# Patient Record
Sex: Female | Born: 1960 | Hispanic: No | Marital: Married | Smoking: Current some day smoker
Health system: Southern US, Community
[De-identification: ages and names within clinical notes are randomized; demographics above are authoritative.]

## PROBLEM LIST (undated history)

## (undated) DIAGNOSIS — I10 Essential (primary) hypertension: Secondary | ICD-10-CM

## (undated) DIAGNOSIS — J45909 Unspecified asthma, uncomplicated: Secondary | ICD-10-CM

## (undated) HISTORY — PX: COLONOSCOPY: SHX174

---

## 2004-07-10 ENCOUNTER — Other Ambulatory Visit: Admission: RE | Admit: 2004-07-10 | Discharge: 2004-07-10 | Payer: Self-pay | Admitting: Dermatology

## 2007-12-16 ENCOUNTER — Ambulatory Visit: Payer: Self-pay | Admitting: Internal Medicine

## 2007-12-16 DIAGNOSIS — R05 Cough: Secondary | ICD-10-CM

## 2007-12-16 DIAGNOSIS — R0602 Shortness of breath: Secondary | ICD-10-CM

## 2007-12-16 DIAGNOSIS — J45909 Unspecified asthma, uncomplicated: Secondary | ICD-10-CM | POA: Insufficient documentation

## 2007-12-16 DIAGNOSIS — I10 Essential (primary) hypertension: Secondary | ICD-10-CM | POA: Insufficient documentation

## 2007-12-16 DIAGNOSIS — J309 Allergic rhinitis, unspecified: Secondary | ICD-10-CM | POA: Insufficient documentation

## 2008-01-22 ENCOUNTER — Ambulatory Visit: Payer: Self-pay | Admitting: Internal Medicine

## 2008-01-22 DIAGNOSIS — F17200 Nicotine dependence, unspecified, uncomplicated: Secondary | ICD-10-CM | POA: Insufficient documentation

## 2016-12-05 DIAGNOSIS — R52 Pain, unspecified: Secondary | ICD-10-CM | POA: Diagnosis not present

## 2016-12-10 DIAGNOSIS — Z0001 Encounter for general adult medical examination with abnormal findings: Secondary | ICD-10-CM | POA: Diagnosis not present

## 2016-12-10 DIAGNOSIS — I10 Essential (primary) hypertension: Secondary | ICD-10-CM | POA: Diagnosis not present

## 2016-12-10 DIAGNOSIS — N951 Menopausal and female climacteric states: Secondary | ICD-10-CM | POA: Diagnosis not present

## 2016-12-10 DIAGNOSIS — J301 Allergic rhinitis due to pollen: Secondary | ICD-10-CM | POA: Diagnosis not present

## 2017-08-22 DIAGNOSIS — Z720 Tobacco use: Secondary | ICD-10-CM | POA: Diagnosis not present

## 2017-08-22 DIAGNOSIS — G4700 Insomnia, unspecified: Secondary | ICD-10-CM | POA: Diagnosis not present

## 2017-11-06 DIAGNOSIS — Z6830 Body mass index (BMI) 30.0-30.9, adult: Secondary | ICD-10-CM | POA: Diagnosis not present

## 2017-11-06 DIAGNOSIS — Z0000 Encounter for general adult medical examination without abnormal findings: Secondary | ICD-10-CM | POA: Diagnosis not present

## 2017-11-06 DIAGNOSIS — Z7689 Persons encountering health services in other specified circumstances: Secondary | ICD-10-CM | POA: Diagnosis not present

## 2017-12-17 DIAGNOSIS — R609 Edema, unspecified: Secondary | ICD-10-CM | POA: Diagnosis not present

## 2017-12-17 DIAGNOSIS — I10 Essential (primary) hypertension: Secondary | ICD-10-CM | POA: Diagnosis not present

## 2017-12-17 DIAGNOSIS — J019 Acute sinusitis, unspecified: Secondary | ICD-10-CM | POA: Diagnosis not present

## 2017-12-23 DIAGNOSIS — D225 Melanocytic nevi of trunk: Secondary | ICD-10-CM | POA: Diagnosis not present

## 2017-12-23 DIAGNOSIS — Z1283 Encounter for screening for malignant neoplasm of skin: Secondary | ICD-10-CM | POA: Diagnosis not present

## 2018-04-08 DIAGNOSIS — M79671 Pain in right foot: Secondary | ICD-10-CM | POA: Diagnosis not present

## 2018-04-08 DIAGNOSIS — S99921A Unspecified injury of right foot, initial encounter: Secondary | ICD-10-CM | POA: Diagnosis not present

## 2018-04-10 DIAGNOSIS — S99921A Unspecified injury of right foot, initial encounter: Secondary | ICD-10-CM | POA: Diagnosis not present

## 2018-04-10 DIAGNOSIS — M79671 Pain in right foot: Secondary | ICD-10-CM | POA: Diagnosis not present

## 2018-04-14 DIAGNOSIS — N3001 Acute cystitis with hematuria: Secondary | ICD-10-CM | POA: Diagnosis not present

## 2018-04-14 DIAGNOSIS — R300 Dysuria: Secondary | ICD-10-CM | POA: Diagnosis not present

## 2018-09-09 DIAGNOSIS — J019 Acute sinusitis, unspecified: Secondary | ICD-10-CM | POA: Diagnosis not present

## 2018-09-09 DIAGNOSIS — Z6828 Body mass index (BMI) 28.0-28.9, adult: Secondary | ICD-10-CM | POA: Diagnosis not present

## 2018-12-14 DIAGNOSIS — R300 Dysuria: Secondary | ICD-10-CM | POA: Diagnosis not present

## 2018-12-14 DIAGNOSIS — R319 Hematuria, unspecified: Secondary | ICD-10-CM | POA: Diagnosis not present

## 2018-12-14 DIAGNOSIS — Z6829 Body mass index (BMI) 29.0-29.9, adult: Secondary | ICD-10-CM | POA: Diagnosis not present

## 2018-12-24 DIAGNOSIS — M25579 Pain in unspecified ankle and joints of unspecified foot: Secondary | ICD-10-CM | POA: Diagnosis not present

## 2018-12-24 DIAGNOSIS — M62172 Other rupture of muscle (nontraumatic), left ankle and foot: Secondary | ICD-10-CM | POA: Diagnosis not present

## 2018-12-24 DIAGNOSIS — M79671 Pain in right foot: Secondary | ICD-10-CM | POA: Diagnosis not present

## 2018-12-31 DIAGNOSIS — M79671 Pain in right foot: Secondary | ICD-10-CM | POA: Diagnosis not present

## 2018-12-31 DIAGNOSIS — S86312A Strain of muscle(s) and tendon(s) of peroneal muscle group at lower leg level, left leg, initial encounter: Secondary | ICD-10-CM | POA: Diagnosis not present

## 2018-12-31 DIAGNOSIS — M25571 Pain in right ankle and joints of right foot: Secondary | ICD-10-CM | POA: Diagnosis not present

## 2018-12-31 DIAGNOSIS — M65871 Other synovitis and tenosynovitis, right ankle and foot: Secondary | ICD-10-CM | POA: Diagnosis not present

## 2019-01-04 DIAGNOSIS — M66271 Spontaneous rupture of extensor tendons, right ankle and foot: Secondary | ICD-10-CM | POA: Diagnosis not present

## 2019-01-04 DIAGNOSIS — M79671 Pain in right foot: Secondary | ICD-10-CM | POA: Diagnosis not present

## 2019-01-04 DIAGNOSIS — M7751 Other enthesopathy of right foot: Secondary | ICD-10-CM | POA: Diagnosis not present

## 2019-01-18 DIAGNOSIS — M79671 Pain in right foot: Secondary | ICD-10-CM | POA: Diagnosis not present

## 2019-01-18 DIAGNOSIS — M66271 Spontaneous rupture of extensor tendons, right ankle and foot: Secondary | ICD-10-CM | POA: Diagnosis not present

## 2019-01-18 DIAGNOSIS — M7751 Other enthesopathy of right foot: Secondary | ICD-10-CM | POA: Diagnosis not present

## 2019-02-08 DIAGNOSIS — M79671 Pain in right foot: Secondary | ICD-10-CM | POA: Diagnosis not present

## 2019-02-08 DIAGNOSIS — M7751 Other enthesopathy of right foot: Secondary | ICD-10-CM | POA: Diagnosis not present

## 2019-02-08 DIAGNOSIS — M66271 Spontaneous rupture of extensor tendons, right ankle and foot: Secondary | ICD-10-CM | POA: Diagnosis not present

## 2021-07-28 DIAGNOSIS — I776 Arteritis, unspecified: Secondary | ICD-10-CM

## 2021-07-28 HISTORY — DX: Arteritis, unspecified: I77.6

## 2021-08-23 ENCOUNTER — Other Ambulatory Visit: Payer: Self-pay

## 2021-08-23 ENCOUNTER — Encounter (HOSPITAL_BASED_OUTPATIENT_CLINIC_OR_DEPARTMENT_OTHER): Payer: Self-pay | Admitting: Orthopedic Surgery

## 2021-08-23 NOTE — Progress Notes (Signed)
Spoke w/ via phone for pre-op interview--- [employer]----     ISTAT, EKG          Lab results------ COVID test -----patient states asymptomatic no test needed Arrive at ------- 0745 NPO after MN NO Solid Food.  Clear liquids from MN until--- 0645 Med rec completed Medications to take morning of surgery ----- Inhaler if needed. Diabetic medication ----- Patient instructed no nail polish to be worn day of surgery Patient instructed to bring photo id and insurance card day of surgery Patient aware to have Driver (ride ) / caregiver    for 24 hours after surgery  Patient Special Instructions ----- Pre-Op special Istructions ----- Patient verbalized understanding of instructions that were given at this phone interview. Patient denies shortness of breath, chest pain, fever, cough at this phone interview.

## 2021-08-23 NOTE — H&P (Signed)
Preoperative History & Physical Exam  Surgeon: Matt Holmes, MD  Diagnosis: right dequervains tenosynovitis, right radial wrist ganglion cyst  Planned Procedure: Procedure(s) (LRB): MINOR RELEASE DORSAL COMPARTMENT (DEQUERVAINS) and ganglion wrist excision (Right)  History of Present Illness:   Patient is a 60 y.o. female with symptoms consistent with  right dequervains tenosynovitis, right radial wrist ganglion cyst who presents for surgical intervention. The risks, benefits and alternatives of surgical intervention were discussed and informed consent was obtained prior to surgery.  Past Medical History: No past medical history on file.  Past Surgical History: The histories are not reviewed yet. Please review them in the "History" navigator section and refresh this [place].  Medications:  Prior to Admission medications   Not on File    Allergies:  Lisinopril  Review of Systems: Negative except per HPI.  Physical Exam: Alert and oriented, NAD Head and neck: no masses, normal alignment CV: pulse intact Pulm: no increased work of breathing, respirations even and unlabored Abdomen: non-distended Extremities: extremities warm and well perfused  LABS: No results found for this or any previous visit (from the past 2160 hour(s)).   Complete History and Physical exam available in the office notes  Alicia Mann

## 2021-08-26 NOTE — H&P (Signed)
Preoperative History & Physical Exam  Surgeon: Matt Holmes, MD  Diagnosis: right dequervains tenosynovitis, right radial wrist ganglion cyst  Planned Procedure: Procedure(s) (LRB): MINOR RELEASE DORSAL COMPARTMENT (DEQUERVAINS) and ganglion wrist excision (Right)  History of Present Illness:   Patient is a 60 y.o. female with symptoms consistent with  right dequervains tenosynovitis, right radial wrist ganglion cyst who presents for surgical intervention. The risks, benefits and alternatives of surgical intervention were discussed and informed consent was obtained prior to surgery.  Past Medical History:  Past Medical History:  Diagnosis Date   Asthma    Hypertension    Vasculitis ([place]) 07/2021    Past Surgical History:  Past Surgical History:  Procedure Laterality Date   COLONOSCOPY N/A    2012    Medications:  Prior to Admission medications   Medication Sig Start Date End Date Taking? Authorizing Provider  furosemide (LASIX) 20 MG tablet Take 20 mg by mouth.   Yes [provider]  Ipratropium-Albuterol (COMBIVENT IN) Inhale into the lungs daily as needed.   Yes [provider]  TRAZODONE HCL PO Take 50 mg by mouth.   Yes [provider]  triamterene-hydrochlorothiazide (MAXZIDE) 75-50 MG tablet Take 1 tablet by mouth daily.   Yes [provider]    Allergies:  Lisinopril  Review of Systems: Negative except per HPI.  Physical Exam: Alert and oriented, NAD Head and neck: no masses, normal alignment CV: pulse intact Pulm: no increased work of breathing, respirations even and unlabored Abdomen: non-distended Extremities: extremities warm and well perfused  LABS: No results found for this or any previous visit (from the past 2160 hour(s)).   Complete History and Physical exam available in the office notes  Orene Desanctis

## 2021-08-30 ENCOUNTER — Ambulatory Visit (HOSPITAL_BASED_OUTPATIENT_CLINIC_OR_DEPARTMENT_OTHER): Payer: 59 | Admitting: Anesthesiology

## 2021-08-30 ENCOUNTER — Encounter (HOSPITAL_BASED_OUTPATIENT_CLINIC_OR_DEPARTMENT_OTHER)
Admission: RE | Disposition: A | Discharge status: Home / Self Care | Payer: Self-pay | Source: Home / Self Care | Attending: Orthopedic Surgery

## 2021-08-30 ENCOUNTER — Encounter (HOSPITAL_BASED_OUTPATIENT_CLINIC_OR_DEPARTMENT_OTHER): Payer: Self-pay | Admitting: Orthopedic Surgery

## 2021-08-30 ENCOUNTER — Ambulatory Visit (HOSPITAL_BASED_OUTPATIENT_CLINIC_OR_DEPARTMENT_OTHER)
Admission: RE | Admit: 2021-08-30 | Discharge: 2021-08-30 | Disposition: A | Discharge status: Home / Self Care | Payer: 59 | Attending: Orthopedic Surgery | Admitting: Orthopedic Surgery

## 2021-08-30 DIAGNOSIS — M67431 Ganglion, right wrist: Secondary | ICD-10-CM | POA: Diagnosis present

## 2021-08-30 DIAGNOSIS — I10 Essential (primary) hypertension: Secondary | ICD-10-CM | POA: Insufficient documentation

## 2021-08-30 DIAGNOSIS — Z79899 Other long term (current) drug therapy: Secondary | ICD-10-CM | POA: Diagnosis not present

## 2021-08-30 DIAGNOSIS — Z888 Allergy status to other drugs, medicaments and biological substances status: Secondary | ICD-10-CM | POA: Diagnosis not present

## 2021-08-30 DIAGNOSIS — M654 Radial styloid tenosynovitis [de Quervain]: Secondary | ICD-10-CM | POA: Diagnosis present

## 2021-08-30 DIAGNOSIS — F17200 Nicotine dependence, unspecified, uncomplicated: Secondary | ICD-10-CM | POA: Diagnosis not present

## 2021-08-30 HISTORY — PX: MINOR RELEASE DORSAL COMPARTMENT (DEQUERVAINS): SHX5980

## 2021-08-30 HISTORY — DX: Unspecified asthma, uncomplicated: J45.909

## 2021-08-30 HISTORY — DX: Essential (primary) hypertension: I10

## 2021-08-30 LAB — POCT I-STAT, CHEM 8
BUN: 25 mg/dL — ABNORMAL HIGH (ref 6–20)
Calcium, Ion: 1.26 mmol/L (ref 1.15–1.40)
Chloride: 95 mmol/L — ABNORMAL LOW (ref 98–111)
Creatinine, Ser: 1.2 mg/dL — ABNORMAL HIGH (ref 0.44–1.00)
Glucose, Bld: 97 mg/dL (ref 70–99)
HCT: 53 % — ABNORMAL HIGH (ref 36.0–46.0)
Hemoglobin: 18 g/dL — ABNORMAL HIGH (ref 12.0–15.0)
Potassium: 3.8 mmol/L (ref 3.5–5.1)
Sodium: 139 mmol/L (ref 135–145)
TCO2: 32 mmol/L (ref 22–32)

## 2021-08-30 SURGERY — MINOR RELEASE DORSAL COMPARTMENT (DEQUERVAINS)
Anesthesia: Monitor Anesthesia Care | Site: Hand | Laterality: Right

## 2021-08-30 MED ORDER — ACETAMINOPHEN 500 MG PO TABS
ORAL_TABLET | ORAL | Status: AC
  Filled 2021-08-30: qty 2

## 2021-08-30 MED ORDER — BUPIVACAINE HCL (PF) 0.5 % IJ SOLN
INTRAMUSCULAR | Status: DC | PRN
  Administered 2021-08-30: 6 mL

## 2021-08-30 MED ORDER — CEFAZOLIN SODIUM-DEXTROSE 2-4 GM/100ML-% IV SOLN
INTRAVENOUS | Status: AC
  Filled 2021-08-30: qty 100

## 2021-08-30 MED ORDER — AMISULPRIDE (ANTIEMETIC) 5 MG/2ML IV SOLN: 10.0000 mg | Freq: Once | INTRAVENOUS | Status: DC | PRN

## 2021-08-30 MED ORDER — LACTATED RINGERS IV SOLN: INTRAVENOUS | Status: DC

## 2021-08-30 MED ORDER — HYDROCODONE-ACETAMINOPHEN 7.5-325 MG PO TABS: 1.0000 | ORAL_TABLET | Freq: Once | ORAL | Status: DC | PRN

## 2021-08-30 MED ORDER — 0.9 % SODIUM CHLORIDE (POUR BTL) OPTIME
TOPICAL | Status: DC | PRN
  Administered 2021-08-30: 500 mL

## 2021-08-30 MED ORDER — PROPOFOL 10 MG/ML IV BOLUS
INTRAVENOUS | Status: DC | PRN
  Administered 2021-08-30: 40 mg via INTRAVENOUS
  Administered 2021-08-30: 20 mg via INTRAVENOUS

## 2021-08-30 MED ORDER — ACETAMINOPHEN 500 MG PO TABS
1000.0000 mg | ORAL_TABLET | Freq: Once | ORAL | Status: AC
  Administered 2021-08-30: 1000 mg via ORAL

## 2021-08-30 MED ORDER — FENTANYL CITRATE (PF) 100 MCG/2ML IJ SOLN: 25.0000 ug | INTRAMUSCULAR | Status: DC | PRN

## 2021-08-30 MED ORDER — PROMETHAZINE HCL 25 MG/ML IJ SOLN: 6.2500 mg | INTRAMUSCULAR | Status: DC | PRN

## 2021-08-30 MED ORDER — PROPOFOL 500 MG/50ML IV EMUL
INTRAVENOUS | Status: DC | PRN
  Administered 2021-08-30: 100 ug/kg/min via INTRAVENOUS

## 2021-08-30 MED ORDER — LIDOCAINE 2% (20 MG/ML) 5 ML SYRINGE
INTRAMUSCULAR | Status: AC
  Filled 2021-08-30: qty 5

## 2021-08-30 MED ORDER — BUPIVACAINE HCL (PF) 0.25 % IJ SOLN: INTRAMUSCULAR | Status: DC | PRN

## 2021-08-30 MED ORDER — CEFAZOLIN SODIUM-DEXTROSE 2-4 GM/100ML-% IV SOLN
2.0000 g | INTRAVENOUS | Status: AC
  Administered 2021-08-30: 2 g via INTRAVENOUS

## 2021-08-30 MED ORDER — LIDOCAINE 2% (20 MG/ML) 5 ML SYRINGE
INTRAMUSCULAR | Status: DC | PRN
  Administered 2021-08-30: 40 mg via INTRAVENOUS

## 2021-08-30 MED ORDER — LIDOCAINE HCL 1 % IJ SOLN
INTRAMUSCULAR | Status: DC | PRN
  Administered 2021-08-30: 4 mL

## 2021-08-30 MED ORDER — TRAMADOL HCL 50 MG PO TABS: 50.0000 mg | ORAL_TABLET | Freq: Three times a day (TID) | ORAL | 0 refills | Status: AC | PRN

## 2021-08-30 SURGICAL SUPPLY — 49 items
BLADE SURG 15 STRL LF DISP TIS (BLADE) ×1 IMPLANT
BLADE SURG 15 STRL SS (BLADE) ×2
BNDG CMPR 9X4 STRL LF SNTH (GAUZE/BANDAGES/DRESSINGS) ×1
BNDG COHESIVE 4X5 TAN ST LF (GAUZE/BANDAGES/DRESSINGS) ×2 IMPLANT
BNDG ELASTIC 4X5.8 VLCR STR LF (GAUZE/BANDAGES/DRESSINGS) ×2 IMPLANT
BNDG ESMARK 4X9 LF (GAUZE/BANDAGES/DRESSINGS) ×2 IMPLANT
CORD BIPOLAR FORCEPS 12FT (ELECTRODE) ×2 IMPLANT
COVER BACK TABLE 60X90IN (DRAPES) IMPLANT
CUFF TOURN SGL QUICK 18X4 (TOURNIQUET CUFF) IMPLANT
DECANTER SPIKE VIAL GLASS SM (MISCELLANEOUS) IMPLANT
DRAPE EXTREMITY T 121X128X90 (DISPOSABLE) ×2 IMPLANT
DRAPE IMP U-DRAPE 54X76 (DRAPES) ×4 IMPLANT
DRAPE SURG 17X23 STRL (DRAPES) ×2 IMPLANT
ELECT REM PT RETURN 9FT ADLT (ELECTROSURGICAL) ×2
ELECTRODE REM PT RTRN 9FT ADLT (ELECTROSURGICAL) ×1 IMPLANT
GAUZE SPONGE 4X4 12PLY STRL (GAUZE/BANDAGES/DRESSINGS) ×2 IMPLANT
GAUZE SPONGE 4X4 12PLY STRL LF (GAUZE/BANDAGES/DRESSINGS) ×1 IMPLANT
GAUZE XEROFORM 1X8 LF (GAUZE/BANDAGES/DRESSINGS) ×2 IMPLANT
GLOVE SURG ENC MOIS LTX SZ7.5 (GLOVE) ×2 IMPLANT
GLOVE SURG UNDER POLY LF SZ7.5 (GLOVE) ×2 IMPLANT
GOWN STRL REUS W/ TWL LRG LVL3 (GOWN DISPOSABLE) ×1 IMPLANT
GOWN STRL REUS W/TWL LRG LVL3 (GOWN DISPOSABLE) ×2
HIBICLENS CHG 4% 4OZ (MISCELLANEOUS) ×2 IMPLANT
NS IRRIG 1000ML POUR BTL (IV SOLUTION) ×2 IMPLANT
PACK BASIN DAY SURGERY FS (CUSTOM PROCEDURE TRAY) ×2 IMPLANT
PAD CAST 4YDX4 CTTN HI CHSV (CAST SUPPLIES) ×1 IMPLANT
PADDING CAST ABS 4INX4YD NS (CAST SUPPLIES) ×1
PADDING CAST ABS COTTON 4X4 ST (CAST SUPPLIES) IMPLANT
PADDING CAST COTTON 4X4 STRL (CAST SUPPLIES) ×2
PENCIL SMOKE EVACUATOR (MISCELLANEOUS) ×2 IMPLANT
SLEEVE SCD COMPRESS KNEE MED (STOCKING) IMPLANT
SLING ARM FOAM STRAP LRG (SOFTGOODS) ×2 IMPLANT
SPLINT FAST PLASTER 5X30 (CAST SUPPLIES) ×1
SPLINT PLASTER CAST FAST 5X30 (CAST SUPPLIES) ×1 IMPLANT
SPLINT PLASTER CAST XFAST 3X15 (CAST SUPPLIES) ×10 IMPLANT
SPLINT PLASTER XTRA FASTSET 3X (CAST SUPPLIES) ×10
STOCKINETTE 4X48 STRL (DRAPES) ×2 IMPLANT
STRIP CLOSURE SKIN 1/2X4 (GAUZE/BANDAGES/DRESSINGS) IMPLANT
SUCTION FRAZIER HANDLE 10FR (MISCELLANEOUS) ×2
SUCTION TUBE FRAZIER 10FR DISP (MISCELLANEOUS) ×1 IMPLANT
SUT MNCRL AB 3-0 PS2 18 (SUTURE) IMPLANT
SUT MNCRL AB 4-0 PS2 18 (SUTURE) IMPLANT
SUT PROLENE 3 0 PS 2 (SUTURE) IMPLANT
SUT VIC AB 3-0 SH 27 (SUTURE)
SUT VIC AB 3-0 SH 27X BRD (SUTURE) IMPLANT
SYR BULB EAR ULCER 3OZ GRN STR (SYRINGE) ×2 IMPLANT
TOWEL OR 17X26 10 PK STRL BLUE (TOWEL DISPOSABLE) ×2 IMPLANT
TUBE CONNECTING 12X1/4 (SUCTIONS) ×2 IMPLANT
YANKAUER SUCT BULB TIP NO VENT (SUCTIONS) IMPLANT

## 2021-08-30 NOTE — Anesthesia Preprocedure Evaluation (Addendum)
Anesthesia Evaluation  Patient identified by MRN, date of birth, ID band Patient awake    Reviewed: Allergy & Precautions, NPO status , Patient's Chart, lab work & pertinent test results  Airway Mallampati: II  TM Distance: >3 FB Neck ROM: Full    Dental no notable dental hx.    Pulmonary shortness of breath, asthma , Current Smoker,    Pulmonary exam normal breath sounds clear to auscultation       Cardiovascular hypertension, Normal cardiovascular exam Rhythm:Regular Rate:Normal  vasculitis   Neuro/Psych negative neurological ROS  negative psych ROS   GI/Hepatic negative GI ROS, Neg liver ROS,   Endo/Other  negative endocrine ROS  Renal/GU negative Renal ROS  negative genitourinary   Musculoskeletal negative musculoskeletal ROS (+)   Abdominal   Peds negative pediatric ROS (+)  Hematology negative hematology ROS (+)   Anesthesia Other Findings   Reproductive/Obstetrics negative OB ROS                            Anesthesia Physical Anesthesia Plan  ASA: 2  Anesthesia Plan: MAC   Post-op Pain Management:    Induction: Intravenous  PONV Risk Score and Plan: 1 and Propofol infusion, TIVA, Treatment may vary due to age or medical condition and Midazolam  Airway Management Planned: Natural Airway and Simple Face Mask  Additional Equipment: None  Intra-op Plan:   Post-operative Plan:   Informed Consent: I have reviewed the patients History and Physical, chart, labs and discussed the procedure including the risks, benefits and alternatives for the proposed anesthesia with the patient or authorized representative who has indicated his/her understanding and acceptance.       Plan Discussed with: CRNA, Anesthesiologist and Surgeon  Anesthesia Plan Comments: (Local by surgeon. Propofol gtt. GA/LMA as backup. Norton Blizzard, MD  )        Anesthesia Quick Evaluation

## 2021-08-30 NOTE — Op Note (Signed)
OPERATIVE NOTE  DATE OF PROCEDURE: 08/30/2021  SURGEONS:  Primary: Orene Desanctis, MD  PREOPERATIVE DIAGNOSIS: right dequervains tenosynovitis  POSTOPERATIVE DIAGNOSIS: Same  NAME OF PROCEDURE:   Right wrist dequervains release  ANESTHESIA: Monitor Anesthesia Care + Local  SKIN PREPARATION: Hibiclens  ESTIMATED BLOOD LOSS: Minimal  IMPLANTS: none  INDICATIONS:  Alicia Mann is a 60 y.o. female who has the above preoperative diagnosis. The patient has decided to proceed with surgical intervention.  Risks, benefits and alternatives of operative management were discussed including, but not limited to, risks of anesthesia complications, infection, pain, persistent symptoms, stiffness, need for future surgery.  The patient understands, agrees and elects to proceed with surgery.    DESCRIPTION OF PROCEDURE: The patient was met in the pre-operative area and their identity was verified.  The operative location and laterality was also verified and marked.  The patient was brought to the OR and was placed supine on the table.  After repeat patient identification with the operative team anesthesia was provided and the patient was prepped and draped in the usual sterile fashion.  A final timeout was performed verifying the correction patient, procedure, location and laterality.  Preoperative antibiotics were provided and then the right upper extremity was elevated and exsanguinated with an Esmarch and tourniquet inflated to 250 mmHg.  An oblique longitudinal incision was made over the right wrist first dorsal compartment.  Skin and subcutaneous tissues were divided and the radial sensory nerve was carefully retracted dorsally.  The first dorsal compartment was identified and was released completely.  There was a separate extensor pollicis brevis compartment which was released.  The APL and EPB tendons were stable within the sheath at the end of the release with flexion extension of the wrist.  There was no  notable ganglion cyst coming from the first dorsal compartment.  The wound was then thoroughly irrigated and the incision was closed with 4-0 horizontal mattress and simple interrupted nylon sutures.  A sterile soft bandage was applied.  The tourniquet was deflated and the fingers were pink and warm and well-perfused at the end of the case.  Patient tolerated the procedure well was brought to PACU for recovery in stable condition.   Matt Holmes, MD

## 2021-08-30 NOTE — Interval H&P Note (Signed)
No change since H&P. Plan for right wrist dequervains release and right wrist ganglion excision.  Izell [place], MD Orthopaedic Hand Surgeon [facility] Office number: [phone] [address], [place], [place] [address]

## 2021-08-30 NOTE — Discharge Instructions (Addendum)
Orthopaedic Hand Surgery Discharge Instructions  WEIGHT BEARING STATUS: Non weight bearing on operative extremity  INCISION CARE: Keep dressing over your incision clean and dry until 5 days after surgery. You may shower by placing a waterproof covering over your dressing. Once dressing is removed, you may allow water to run over the incision and then place Band-Aids over incision. Do not scrub your incision or apply creams/lotions. Do not submerge your incision or swim for 3 weeks after surgery. Contact your surgeon or primary care doctor if you develop redness or drainage from your incision.   PAIN CONTROL: First line medications for post operative pain control are Tylenol (acetaminophen) and Motrin (ibuprofen) if you are able to take these medications. If you have been prescribed a medication these can be taken as breakthrough pain medications. Please note that some narcotic pain medication have acetaminophen added and you should never consume more than 4,000mg  of acetaminophen in 24 hour period. Also please note that if you are given Toradol (ketoralac) you should not take similar medications simultaneously such as ibuprofen.   ICE/ELEVATION: Ice and elevate your injured extremity as needed. Avoid direct contact of ice with skin.  HOME MEDICATIONS: No changes have been made to your home medications.  FOLLOW UP: You will be called after surgery with an appointment date and time, however if you have not received a phone call within 3 days please call during regular office hours at [phone] to schedule a post operative appointment.  Please Seek Medical Attention if: Call MD for: pain or pressure in chest, jaw, arm, back, neck  Call MD for: temperature greater than 101 F for more than 24 hours  Call MD for: difficulty breathing Call MD for: Incision redness, bleeding, drainage  Call MD for: palpitations or feeling that the heart is racing  Call MD for: increased swelling in arm, leg, ankle,  or abdomen  Call MD for: lightheadedness, dizziness, fainting Go to ED or call 911 if: chest pain does not go away after 3 nitroglycerin doses taken 5 min apart  Go to ED or call 911 for: any uncontrolled bleeding  Go to ED or call 911 if: unable to reach physician  Discharge Medications: Allergies as of 08/30/2021       Reactions   Codeine Nausea And Vomiting   Lisinopril         Medication List     TAKE these medications    COMBIVENT IN Inhale into the lungs daily as needed.   furosemide 20 MG tablet Commonly known as: LASIX Take 20 mg by mouth.   traMADol 50 MG tablet Commonly known as: Ultram Take 1 tablet (50 mg total) by mouth 3 (three) times daily as needed for up to 3 days.   TRAZODONE HCL PO Take 50 mg by mouth.   triamterene-hydrochlorothiazide 75-50 MG tablet Commonly known as: MAXZIDE Take 1 tablet by mouth daily.          Izell [place], MD Orthopaedic Hand Surgeon [facility] Office number: [phone] [address], [address] [place], [place] [address]    Post Anesthesia Home Care Instructions  Activity: Get plenty of rest for the remainder of the day. A responsible individual must stay with you for 24 hours following the procedure.  For the next 24 hours, DO NOT: -Drive a car -Paediatric nurse -Drink alcoholic beverages -Take any medication unless instructed by your physician -Make any legal decisions or sign important papers.  Meals: Start with liquid foods such as gelatin or soup.  Progress to regular foods as tolerated. Avoid greasy, spicy, heavy foods. If nausea and/or vomiting occur, drink only clear liquids until the nausea and/or vomiting subsides. Call your physician if vomiting continues.  Special Instructions/Symptoms: Your throat may feel dry or sore from the anesthesia or the breathing tube placed in your throat during surgery. If this causes discomfort, gargle with warm salt water. The discomfort should disappear within  24 hours.  May take Tylenol beginning at 2 PM as needed for pain.

## 2021-08-30 NOTE — Transfer of Care (Signed)
Immediate Anesthesia Transfer of Care Note  Patient: Alicia Mann  Procedure(s) Performed: MINOR RELEASE DORSAL COMPARTMENT (DEQUERVAINS) and ganglion wrist excision (Right: Hand)  Patient Location: Short Stay  Anesthesia Type:MAC  Level of Consciousness: awake, alert , oriented and patient cooperative  Airway & Oxygen Therapy: Patient Spontanous Breathing  Post-op Assessment: Report given to RN and Post -op Vital signs reviewed and stable  Post vital signs: Reviewed and stable  Last Vitals:  Vitals Value Taken Time  BP    Temp    Pulse    Resp    SpO2      Last Pain:  Vitals:   08/30/21 0754  TempSrc: Oral  PainSc: 3       Patients Stated Pain Goal: 3 (74/82/70 [id number])  Complications: No notable events documented.

## 2021-08-30 NOTE — Anesthesia Postprocedure Evaluation (Signed)
Anesthesia Post Note  Patient: Bricia N Ramo  Procedure(s) Performed: MINOR RELEASE DORSAL COMPARTMENT (DEQUERVAINS) and ganglion wrist excision (Right: Hand)     Patient location during evaluation: PACU Anesthesia Type: MAC Level of consciousness: awake and alert Pain management: pain level controlled Vital Signs Assessment: post-procedure vital signs reviewed and stable Respiratory status: spontaneous breathing and respiratory function stable Cardiovascular status: stable Postop Assessment: no apparent nausea or vomiting Anesthetic complications: no   No notable events documented.  Last Vitals:  Vitals:   08/30/21 0754 08/30/21 1004  BP: 136/87 115/65  Pulse: (!) 106 85  Resp: 17 14  Temp: 36.9 C 36.7 C  SpO2: 99% 95%    Last Pain:  Vitals:   08/30/21 1004  TempSrc:   PainSc: 0-No pain                 Merlinda Frederick

## 2021-08-31 ENCOUNTER — Encounter (HOSPITAL_BASED_OUTPATIENT_CLINIC_OR_DEPARTMENT_OTHER): Payer: Self-pay | Admitting: Orthopedic Surgery

## 2021-09-17 ENCOUNTER — Other Ambulatory Visit: Payer: Self-pay

## 2021-09-17 ENCOUNTER — Other Ambulatory Visit (HOSPITAL_COMMUNITY): Payer: Self-pay | Admitting: Dermatology

## 2021-09-17 ENCOUNTER — Ambulatory Visit (HOSPITAL_COMMUNITY)
Admission: RE | Admit: 2021-09-17 | Discharge: 2021-09-17 | Disposition: A | Discharge status: Home / Self Care | Payer: 59 | Source: Ambulatory Visit | Attending: Dermatology | Admitting: Dermatology

## 2021-09-17 DIAGNOSIS — I776 Arteritis, unspecified: Secondary | ICD-10-CM

## 2021-10-30 NOTE — Progress Notes (Signed)
Patient labs drawn in room prior to leaving Nephrology clinic

## 2021-12-03 NOTE — Progress Notes (Signed)
 RHEUMATOLOGY NEW CLINIC NOTE  Assessment/Plan:  Alicia Mann is a 61 y.o. female with COPD, HTN, tobacco use  and 1 episode of biopsy-proven LCV, latent TB. Serologic workup is negative and she had no abdominal symptoms. Slight elevation in creatinine which may be from her medication. She has had no recurrence of rash and no new symptoms. We congratulated her on stopping smoking. We explained that if the rash comes back she should see her dermatologist again for another biopsy because we need immunofluorescence staining. We will repeat her renal function panel and Dr. Woodie will make recommendations for changing antihypertensive meds. We will see her back prn.  - renal function panel today - consider being treated for latent TB. If patient needs immunosuppression in the future, a +quantiferon will complicate this. - RTC prn   History of Present Illness:  The patient was seen in consultation at the request of Newman Woodie for the evaluation of LCV  Primary Care Provider: Beverley KATHEE Hummer, MD  HPI: Alicia Mann is a 61 y.o.  female seen by Dr. Woodie in January for evaluation of rash. In October/Noc 2022 she developed a b/l LE rash and saw Dermatology. Biopsy was performed and showed LCV - see biopsy report below. She remembers no associated symptoms at the time this started. She took prednisone and by the time she got to Dr. Woodie the rash had gone away. Lab workup:  + Quantiferon gold Urine bland Calcium 10.5 CRP 4 spep normal Ferritin 41 ANCA neg CCP neg C3 and C4 high Cryos neg Hep B and C neg Chest xray (CE) neg  Today, she reports no new lesions, no fevers, chills, HA, oral ulcers, joint swelling, cough, SOB, GI complaints. She stopped smoking with her husband!  She did see the [facility] for her latent TB and would prefer to not treat at this time.  Allergies: Codeine and Lisinopril  Medications:  Outpatient Medications Prior to Visit  Medication Sig Dispense Refill  . albuterol HFA  90 mcg/actuation inhaler     . betamethasone, augmented, (DIPROLENE) 0.05 % cream betamethasone, augmented 0.05 % topical cream  APPLY CREAM TOPICALLY TO AFFECTED AREA ON LEGS TWICE DAILY AS NEEDED DO NOT APPLY TO FACE, GROIN, OR UNDERARMS    . busPIRone (BUSPAR) 7.5 MG tablet Take 7.5 mg by mouth.    . fluticasone propionate (FLOVENT DISKUS) 100 mcg/actuation DsDv diskus Flovent Diskus 100 mcg/actuation powder for inhalation  INHALE 1 PUFF BY MOUTH TWICE DAILY    . furosemide (LASIX) 20 MG tablet Take 20 mg by mouth daily.    SABRA ipratropium (ATROVENT) 42 mcg (0.06 %) nasal spray USE 2 SPRAYS IN EACH NOSTRIL FOUR TIMES DAILY FOR 7 DAYS    . ipratropium-albuteroL (COMBIVENT RESPIMAT) 20-100 mcg/actuation Mist inhaler Combivent Respimat 20 mcg-100 mcg/actuation solution for inhalation  INHALE ONE PUFF FOUR TIMES A DAY    . methocarbamoL (ROBAXIN) 750 MG tablet TAKE 1 TO 2 TABLETS BY MOUTH THREE TIMES DAILY AS NEEDED FOR 30 DAYS    . naproxen (NAPROSYN) 500 MG tablet     . potassium chloride 10 MEQ CR tablet Take 1 tablet (10 mEq total) by mouth daily. 30 tablet 0  . traZODone (DESYREL) 50 MG tablet trazodone 50 mg tablet  TAKE 2 TABLETS BY MOUTH AT BEDTIME    . triamterene-hydrochlorothiazide (MAXZIDE) 75-50 mg per tablet Take 1 tablet by mouth daily.    . varenicline (CHANTIX) 0.5 MG tablet Take 0.5 mg by mouth.     No  facility-administered medications prior to visit.   Social History: Social History   Tobacco Use  . Smoking status: Former    Types: Cigarettes  . Smokeless tobacco: Never  Substance Use Topics  . Alcohol use: Not Currently    Comment: occasional   Family History: Father and sister with thyroid disease  Review of Systems: Please see above in the HPI, the remainder of a 10-system review was unremarkable.  Review of outside records: I have reviewed available records through Epic.   Objective   Physical Exam  General:   Well appearing female in no acute distress   Eyes:   PERRL, EOMI, conjunctivae are clear.  Cardiovascular:  RRR.  S1 and S2 normal, without any murmur, rub, or gallop.  Lungs:  Clear to auscultation bilaterally, without wheeze.  Good air movement. Normal work of breathing.  Skin:    No diffuse rash. Healing ulcers on both legs  Psychiatry:   Alert and oriented to person, place, and time  Extremities:   Warm and well perfused. No edema. 2+ pulses.  Musculo Skeletal:   No joint tenderness, deformity, effusions. Full range of motion in shoulder, elbow, hip, knee, ankle, hands and feet. No evidence of active synovitis in the small joints of the hands or feet.  Neurological:  Cranial nerves III-XII intact. Gait normal. 5/5 strength

## 2021-12-11 NOTE — Progress Notes (Addendum)
 Nephrology follow-up Note  Requesting Attending Physician:  Dr. Shona Courier: 12/04/2021-   Assessment/Recommendations:  This is a 61 year old female with history of lower extremity skin rash, with biopsy suggesting of leukocytoclastic vasculitis, but without a DIF, differential is broad -- IgAV vs immune complex or infection mediated.  Most of her autoimmune serology and inflammatory markers is unrevealing( also just came off prednisone).    Alicia Mann is here for regular follow-up with her husband.  She is doing well overall.  She denies any recurrence of her lower extremity rash, redness of eyes or ears, cough, chest pain, shortness of breath, headache, change in vision, flashes of floaters, vomiting, diarrhea, blood in urine or stool, rash, migratory joint pain, tingling or numbness.  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  We reviewed her labs from last visit.  At this point Alicia Mann is doing well overall.  We reviewed her labs.  Her creatinine has been fluctuating between 1-1.14 last year.  Her urine sediment is bland.  There is no proteinuria.  We discussed about considering changing her triamterene hydrochlorothiazide to ACEi or an ARB with or without diuretic going forward.  I will pass on the recommendations to her primary care physician Dr. Beverley.  She will alert us  of any new symptoms and signs of small vessel vasculitis which we went over.  I have also discussed the case with her dermatologist, and if she has any recurrence of rash, she will undergo a skin biopsy with direct immunofluorescence for diagnosis of LCV. We encouraged her to consider treatment for latent TB, because if she has a recurrence of rash and we consider immunosuppression, she will need treatment at that point .  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  In month of October/November, she suddenly developed bilateral lower extremity rash, following which she was evaluated by dermatology underwent a skin biopsy which showed leukocytoclastic vasculitis and was started on variable dose of prednisone.  She does not recall any history of fever, cough, fatigue, joint pain, hemoptysis, chills, sick contact, travel history, recent change of medication, starting OTC supplementation, blood in urine or stool, hearing loss, redness of eyes or ears at the time of development of her lower extremity rash.  She has history of hypertension well-controlled on medication for years.  She does not have diabetes, prior history of kidney disease, cancer, or other significant past medical history.  She does endorse to smoking a pack a day for almost 15 to 20 years.  No prior history of prolonged hospitalization, infections, or recent travel history. She is off prednisone now (was stopped about 1-2 weeks ago)    Allergies: codeine, lisinopril ( cough)  Medications:  Current Outpatient Medications  Medication Sig Dispense Refill  . albuterol HFA 90 mcg/actuation inhaler     . betamethasone, augmented, (DIPROLENE) 0.05 % cream betamethasone, augmented 0.05 % topical cream  APPLY CREAM TOPICALLY TO AFFECTED AREA ON LEGS TWICE DAILY AS NEEDED DO NOT APPLY TO FACE,  GROIN, OR UNDERARMS    . busPIRone (BUSPAR) 7.5 MG tablet Take 7.5 mg by mouth.    . fluticasone propionate (FLOVENT DISKUS) 100 mcg/actuation DsDv diskus Flovent Diskus 100 mcg/actuation powder for inhalation  INHALE 1 PUFF BY MOUTH TWICE DAILY    . furosemide (LASIX)  20 MG tablet Take 20 mg by mouth daily.    SABRA ipratropium (ATROVENT) 42 mcg (0.06 %) nasal spray USE 2 SPRAYS IN EACH NOSTRIL FOUR TIMES DAILY FOR 7 DAYS    . ipratropium-albuteroL (COMBIVENT RESPIMAT) 20-100 mcg/actuation Mist inhaler Combivent Respimat 20 mcg-100 mcg/actuation solution for inhalation  INHALE ONE PUFF FOUR TIMES A DAY    . methocarbamoL (ROBAXIN) 750 MG tablet TAKE 1 TO 2 TABLETS BY MOUTH THREE TIMES DAILY AS NEEDED FOR 30 DAYS    . naproxen (NAPROSYN) 500 MG tablet     . potassium chloride 10 MEQ CR tablet Take 1 tablet (10 mEq total) by mouth daily. 30 tablet 0  . traZODone (DESYREL) 50 MG tablet trazodone 50 mg tablet  TAKE 2 TABLETS BY MOUTH AT BEDTIME    . triamterene-hydrochlorothiazide (MAXZIDE) 75-50 mg per tablet Take 1 tablet by mouth daily.    . varenicline (CHANTIX) 0.5 MG tablet Take 0.5 mg by mouth.     No current facility-administered medications for this visit.    Review of Systems: A 12 system review of systems was negative except as noted in HPI.  Objective    Physical Exam: She is awake alert and orient x3 No auricle tenderness No conjunctival injection No oral thrush or ulcers No stridor S1-S2 is regular Lungs are clear to auscultation bilaterally Abdomen: Soft and nontender Skin: No purpuric rash MSK: No evidence of active synovitis Neuro: Clear speech, normal gait LE: No significant edema   Labs: Urine sediment personally reviewed does not show any evidence of glomerular  hematuria hgb of 15, white count of 5.8, platelet 270 Potassium of 3.2, creatinine 0.9, calcium 10.5, albumin 3.9, C3 at 178 and C4 at 55 ANCA is negative Rheumatoid factor 6.7, low Cryo is pending SPEP and IFE does not show evidence of monoclonal  serum free light chain ratio of 2.10 Reactive protein and 4 Ferritin at 41.3 TB Gold positive Hepatitis B core negative

## 2022-11-17 IMAGING — DX DG CHEST 2V
2 series · 2 of 2 positions shown · non-contrast
Comparison: 12/17/2017

CLINICAL DATA: Vasculitis chest pain

EXAM:
CHEST - 2 VIEW

[chest pa]
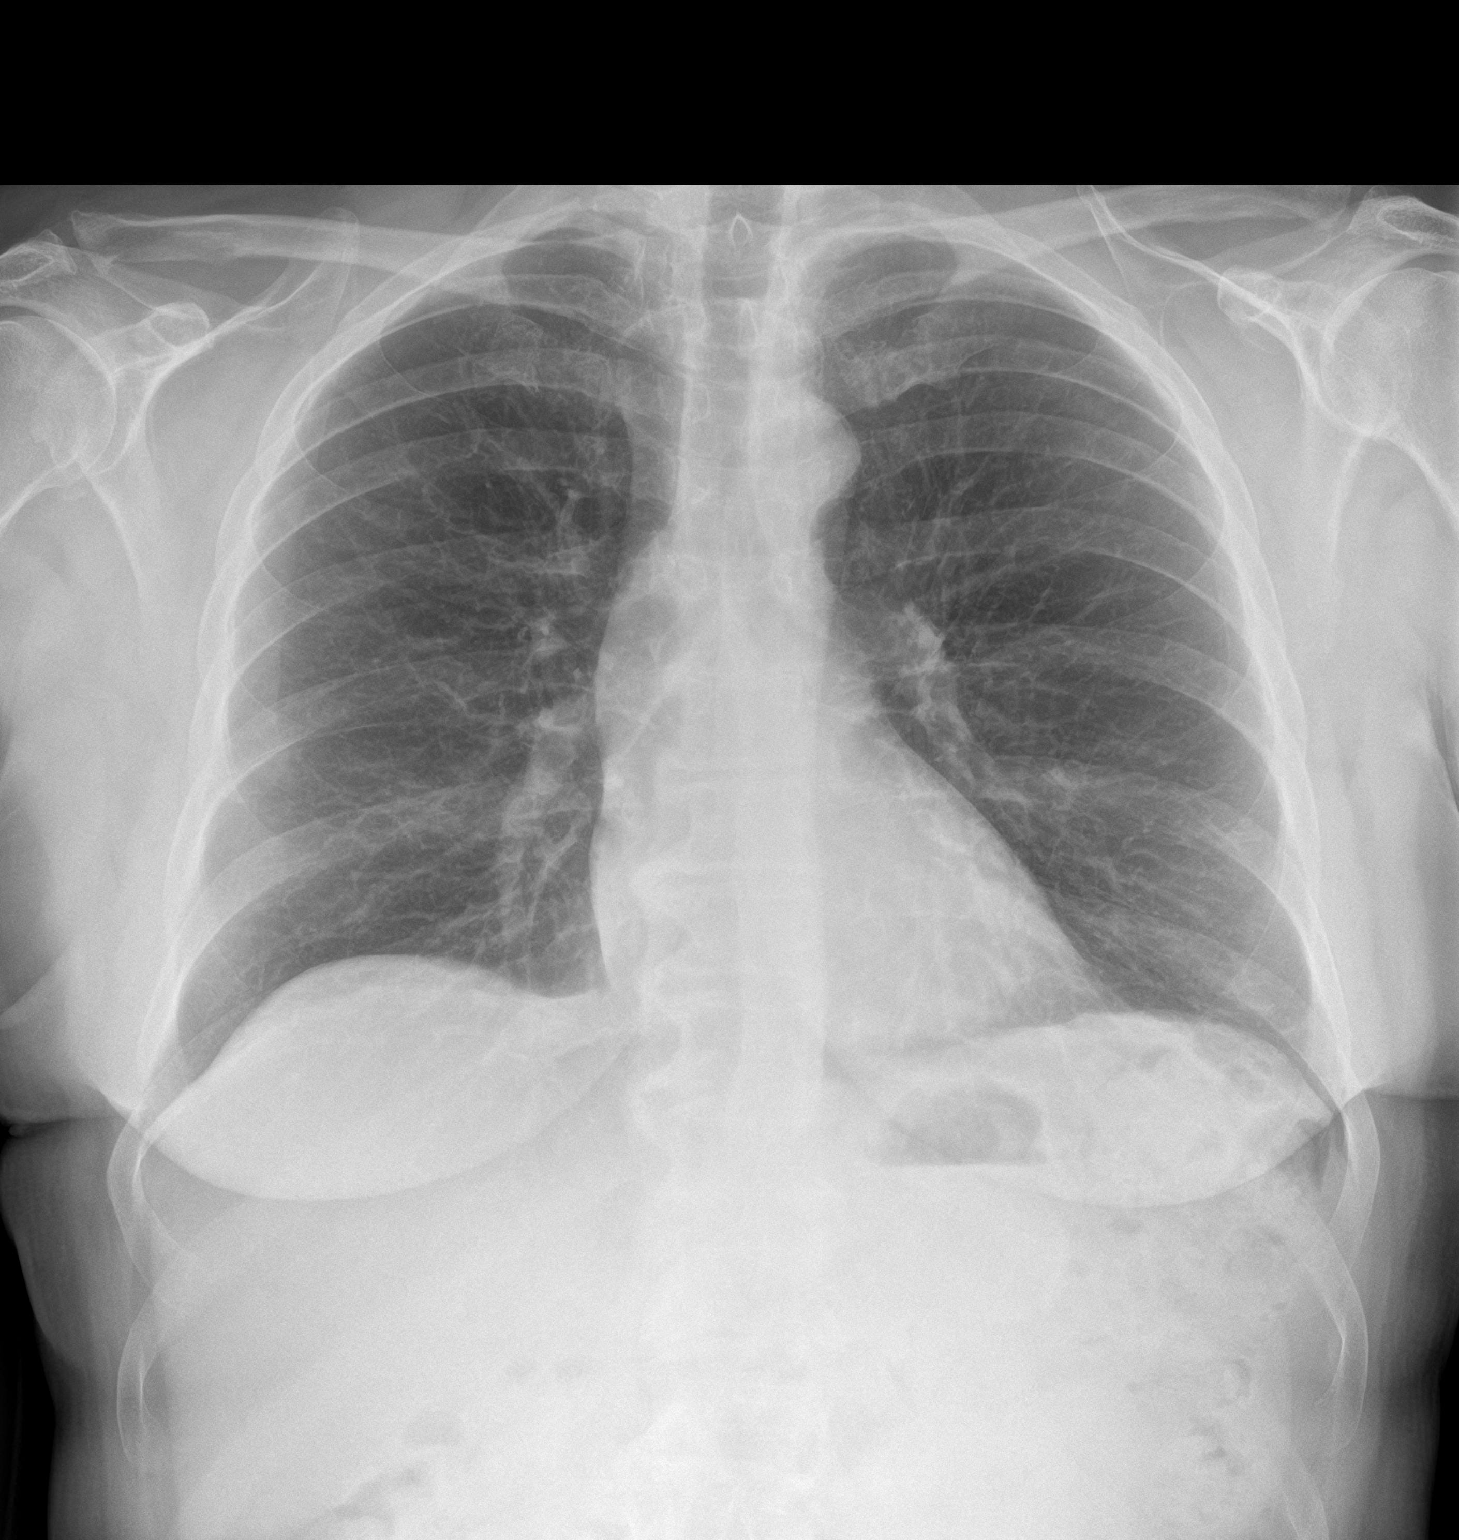

[chest lat]
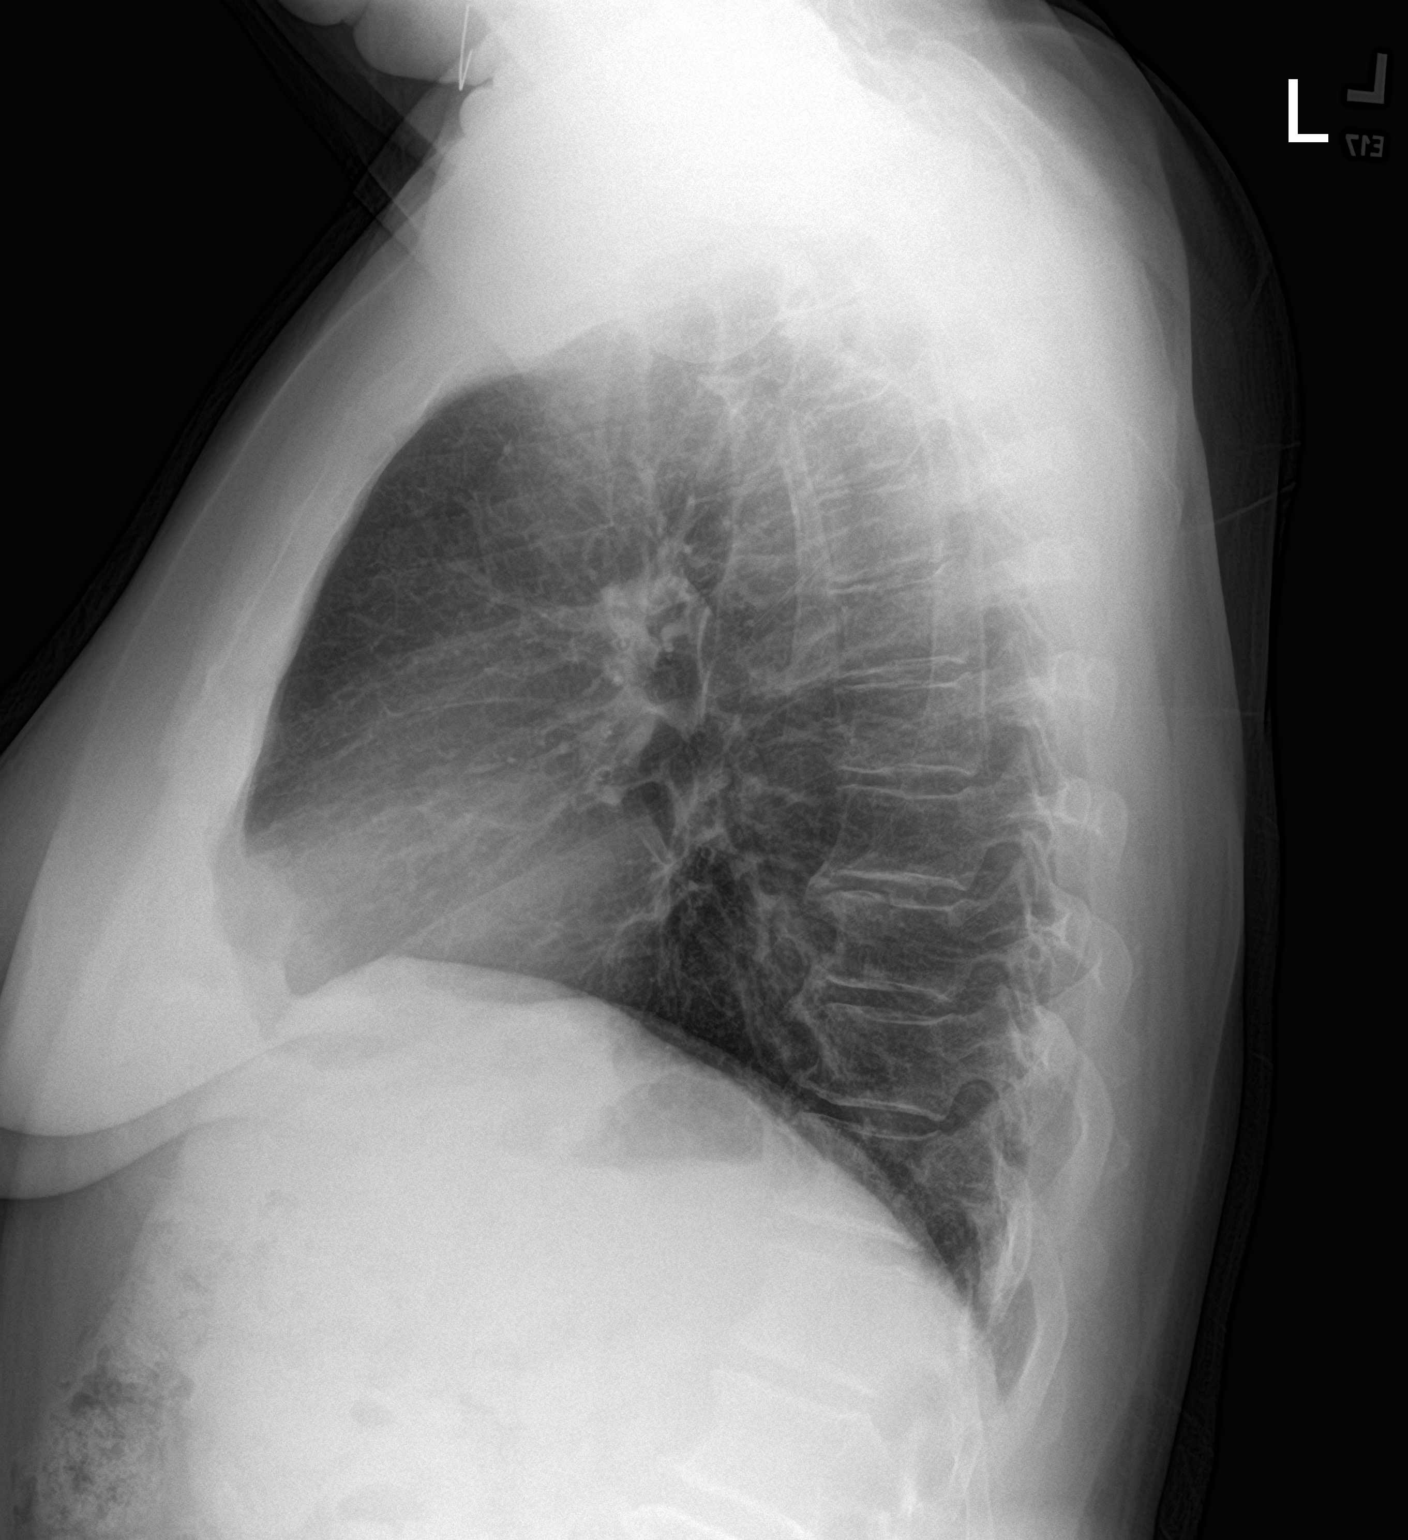

[2 of 2 positions shown; findings below may reference images not displayed]

FINDINGS: The heart size and mediastinal contours are within normal limits.
Both lungs are clear. Degenerative changes of the spine.
IMPRESSION: No active cardiopulmonary disease.

## 2024-04-23 ENCOUNTER — Other Ambulatory Visit (HOSPITAL_COMMUNITY): Payer: Self-pay | Admitting: Nephrology

## 2024-04-23 DIAGNOSIS — E871 Hypo-osmolality and hyponatremia: Secondary | ICD-10-CM

## 2024-04-23 DIAGNOSIS — I10 Essential (primary) hypertension: Secondary | ICD-10-CM

## 2024-04-23 DIAGNOSIS — I129 Hypertensive chronic kidney disease with stage 1 through stage 4 chronic kidney disease, or unspecified chronic kidney disease: Secondary | ICD-10-CM

## 2024-04-23 DIAGNOSIS — E876 Hypokalemia: Secondary | ICD-10-CM

## 2024-04-23 DIAGNOSIS — Z6829 Body mass index (BMI) 29.0-29.9, adult: Secondary | ICD-10-CM

## 2024-04-23 DIAGNOSIS — N1832 Chronic kidney disease, stage 3b: Secondary | ICD-10-CM

## 2024-04-23 DIAGNOSIS — J449 Chronic obstructive pulmonary disease, unspecified: Secondary | ICD-10-CM

## 2024-04-23 DIAGNOSIS — Z8679 Personal history of other diseases of the circulatory system: Secondary | ICD-10-CM

## 2024-04-23 DIAGNOSIS — R7303 Prediabetes: Secondary | ICD-10-CM

## 2024-04-29 ENCOUNTER — Ambulatory Visit (HOSPITAL_COMMUNITY)
Admission: RE | Admit: 2024-04-29 | Discharge: 2024-04-29 | Disposition: A | Discharge status: Home / Self Care | Source: Ambulatory Visit | Attending: Nephrology | Admitting: Nephrology

## 2024-04-29 DIAGNOSIS — R7303 Prediabetes: Secondary | ICD-10-CM | POA: Diagnosis present

## 2024-04-29 DIAGNOSIS — Z8679 Personal history of other diseases of the circulatory system: Secondary | ICD-10-CM | POA: Diagnosis present

## 2024-04-29 DIAGNOSIS — J449 Chronic obstructive pulmonary disease, unspecified: Secondary | ICD-10-CM | POA: Insufficient documentation

## 2024-04-29 DIAGNOSIS — I10 Essential (primary) hypertension: Secondary | ICD-10-CM | POA: Diagnosis present

## 2024-04-29 DIAGNOSIS — Z6829 Body mass index (BMI) 29.0-29.9, adult: Secondary | ICD-10-CM | POA: Insufficient documentation

## 2024-04-29 DIAGNOSIS — I129 Hypertensive chronic kidney disease with stage 1 through stage 4 chronic kidney disease, or unspecified chronic kidney disease: Secondary | ICD-10-CM | POA: Diagnosis present

## 2024-04-29 DIAGNOSIS — E871 Hypo-osmolality and hyponatremia: Secondary | ICD-10-CM | POA: Diagnosis present

## 2024-04-29 DIAGNOSIS — N1832 Chronic kidney disease, stage 3b: Secondary | ICD-10-CM | POA: Diagnosis present

## 2024-04-29 DIAGNOSIS — E876 Hypokalemia: Secondary | ICD-10-CM | POA: Diagnosis present

## 2024-06-15 ENCOUNTER — Ambulatory Visit (INDEPENDENT_AMBULATORY_CARE_PROVIDER_SITE_OTHER)

## 2024-06-15 ENCOUNTER — Encounter: Payer: Self-pay | Admitting: Podiatry

## 2024-06-15 ENCOUNTER — Ambulatory Visit: Admitting: Podiatry

## 2024-06-15 DIAGNOSIS — M7751 Other enthesopathy of right foot: Secondary | ICD-10-CM | POA: Diagnosis not present

## 2024-06-15 DIAGNOSIS — R262 Difficulty in walking, not elsewhere classified: Secondary | ICD-10-CM

## 2024-06-15 DIAGNOSIS — S9031XA Contusion of right foot, initial encounter: Secondary | ICD-10-CM

## 2024-06-15 DIAGNOSIS — M7661 Achilles tendinitis, right leg: Secondary | ICD-10-CM

## 2024-06-15 MED ORDER — MELOXICAM 15 MG PO TABS: 15.0000 mg | ORAL_TABLET | Freq: Every day | ORAL | 2 refills | Status: AC

## 2024-06-15 NOTE — Progress Notes (Unsigned)
 Chief Complaint  Patient presents with   Foot Pain    Pt rolled foot on July 4th.  Pain 7. Heel and achilles. Painful to touch area. Non Diabetic. No anti coag    HPI: 63 y.o. female presenting today with c/o pain in the back of the right heel.  States that she stepped on a stick in her yard and rolled her foot.  She has sudden pain in the back of the heel at that time and it has been painful ever since.  She does have to wear steel toe shoes at work and cannot take off work and they do not have any light duty options according to the patient.  Past Medical History:  Diagnosis Date   Asthma    Hypertension    Vasculitis (HCC) 07/2021   Past Surgical History:  Procedure Laterality Date   COLONOSCOPY N/A    2012   MINOR RELEASE DORSAL COMPARTMENT (DEQUERVAINS) Right 08/30/2021   Procedure: MINOR RELEASE DORSAL COMPARTMENT (DEQUERVAINS) and ganglion wrist excision;  Surgeon: Alyse Agent, MD;  Location: [facility];  Service: Orthopedics;  Laterality: Right;  with local anesthesia   Allergies  Allergen Reactions   Codeine Nausea And Vomiting   Lisinopril      Physical Exam: General: The patient is alert and oriented x3 in no acute distress.  Dermatology:  No ecchymosis, erythema, or edema bilateral.  No open lesions.    Vascular: Palpable pedal pulses bilaterally. Capillary refill within normal limits.  No appreciable edema.    Neurological: Light touch sensation intact bilateral.  MMT 5/5 to lower extremity bilateral. Negative Tinel's sign with percussion of the posterior tibial nerve on the affected extremity.    Musculoskeletal Exam:  There is pain on palpation of the posterior aspect of the right heel.  No palpable gaps or nodules noted within the achilles tendon.  Antalgic gait noted with first steps out of exam chair.  No pain on palpation of the plantar heel.  No ecchymosis or edema to posterior heel.  Radiographic Exam  67563right foot, 2 weightbearing  views, 06/15/2024):  Normal osseous mineralization. Joint spaces preserved.  No fractures noted.  Small anterior calcaneal spur noted.  There are some very small insertional calcifications of the Achilles tendon seen.  There is some dorsal spurring seen at the talonavicular joint.  Assessment/Plan of Care: 1. Achilles tendinitis, right leg   2. Contusion of right foot, initial encounter   3. Difficulty walking     Meds ordered this encounter  Medications   meloxicam  (MOBIC ) 15 MG tablet    Sig: Take 1 tablet (15 mg total) by mouth daily.    Dispense:  30 tablet    Refill:  2   PR PNEUMAT WALKING BOOT PRE CST  -Reviewed etiology of achilles tendonitis with patient.  Discussed treatment options with patient today, including cortisone injection, NSAID course of treatment, stretching exercises, use of night splint, physical therapy, rest, icing the heel, arch supports/orthotics, and supportive shoe gear.    Patient fitted for a pneumatic cam walker.  This is a prefabricated device that she will wear at all times weightbearing to help immobilize the ankle and decrease strain on the Achilles tendon.  This can help assist with ambulation and decrease pain while allowing healing.  Proof of delivery and insurance waiver signed via motion MD.  Prescription for meloxicam  15 mg 1 tab p.o. daily sent to her pharmacy.  Patient given heel lifts to create slack in the  achilles tendon until symptoms improve.  She will wear these in her work boots.  She cannot wear a cam walker while working because she is required to wear a steel toe shoe.  She will wear her cam walker at all times outside of work.  A cortisone injection was administered to the posterior right heel, consisting of 1% lidocaine  plain, 0.5% bupivocaine plain and Kenalog 10 for a total of 1cc administered. A Bandaid was applied.  Avoid any high impact activities for the next 48 hours.    Patient to stretch the achilles/posterior leg muscles  daily.  Return in about 3 weeks (around 07/06/2024) for f/u achilles tendonitis.   Awanda CHARM Imperial, DPM, FACFAS [facility]     [address] [place], [place] [address]                Office [phone]  Fax [phone]

## 2024-07-05 ENCOUNTER — Encounter: Admitting: Podiatry

## 2024-07-05 NOTE — Progress Notes (Signed)
 Patient did not show for her scheduled appointment this morning.

## 2024-11-26 ENCOUNTER — Ambulatory Visit (INDEPENDENT_AMBULATORY_CARE_PROVIDER_SITE_OTHER): Admitting: Podiatry

## 2024-11-26 ENCOUNTER — Ambulatory Visit (INDEPENDENT_AMBULATORY_CARE_PROVIDER_SITE_OTHER)

## 2024-11-26 ENCOUNTER — Telehealth: Payer: Self-pay | Admitting: Podiatry

## 2024-11-26 ENCOUNTER — Encounter: Payer: Self-pay | Admitting: Podiatry

## 2024-11-26 DIAGNOSIS — M25571 Pain in right ankle and joints of right foot: Secondary | ICD-10-CM

## 2024-11-26 DIAGNOSIS — M109 Gout, unspecified: Secondary | ICD-10-CM

## 2024-11-26 DIAGNOSIS — M778 Other enthesopathies, not elsewhere classified: Secondary | ICD-10-CM

## 2024-11-26 DIAGNOSIS — M7661 Achilles tendinitis, right leg: Secondary | ICD-10-CM

## 2024-11-26 MED ORDER — PREDNISONE 5 MG PO TABS: ORAL_TABLET | ORAL | 1 refills | Status: DC

## 2024-11-26 MED ORDER — PREDNISONE 5 MG PO TABS: ORAL_TABLET | ORAL | 1 refills | Status: AC

## 2024-11-26 NOTE — Progress Notes (Signed)
 Patient presents with pain in the foot for with several days of extreme pain in the right foot on the lateral aspect of the foot.  Got red and swollen extremely painful.  Hard time getting any weight on it whatsoever.  No history of gout in the past.  Does have some mild renal dysfunction.   Physical exam:  General appearance: Pleasant, and in no acute distress. AOx3.  Vascular: Pedal pulses: DP 2/4 bilaterally, PT 2/4 bilaterally.  Minimal edema lower legs bilaterally.  Focal edema on the mid lateral aspect of the right foot.  Capillary fill time immediate bilaterally.  Neurological: Light touch intact feet bilaterally.  Normal Achilles reflex bilaterally.  No clonus or spasticity noted.   Dermatologic:   Skin normal temperature bilaterally.  Redness over the lateral dorsal aspect of the foot right.  No open lesions.  No blisters.  Musculoskeletal: Extreme tenderness over the fourth fifth met cuboid joint right.  Tenderness with attempted no tenderness at the first MTP or the ankle right  Radiographs: 3 views right foot: No fractures or dislocations noted.  No erosive changes noted  Diagnosis: 1.  Arthralgia fourth fifth met cuboid joint right. 2.  Capsulitis right foot.  Plan: -Discussed with her what sounds like AN acute gout flare of the fourth met cuboid joint of the right foot.  Recommend injecting joint corticosteroid and getting prednisone  taper.  Also check her uric acid and a CMP.  Discussed with her etiology and treatment of gout. -Ordered labs uric acid and CMP - Rx prednisone  5 mg, 30 mg p.o. daily first day, then decrease by 5 mg every other day for 12 days.  - Injected 3cc 2:1 mixture 0.5 cc Marcaine : Triamcinolone  40mg /39ml at fourth fifth met cuboid joint right.  Attempted aspiration of it but could not obtain any fluid for pathological evaluation..   Return 1 week follow-up injection right foot and discuss labs

## 2024-11-26 NOTE — Addendum Note (Signed)
 Addended by: CHRISTINE BARTER A on: 11/26/2024 01:06 PM   Modules accepted: Orders

## 2024-11-26 NOTE — Telephone Encounter (Signed)
 Yvonna husband called because he is at the pharmacy to pick up her prescription (Rx prednisone  5 mg, 30 mg) that was sent in today but the pharmacist told him that they didn't receive any orders today. Can we double check that the script was sent in?  Verified the address is correct: Preferred Pharmacy: [facility] [phone] - [place], [place] - [address]

## 2024-12-02 ENCOUNTER — Encounter: Payer: Self-pay | Admitting: Podiatry

## 2024-12-02 ENCOUNTER — Ambulatory Visit: Admitting: Podiatry

## 2024-12-02 ENCOUNTER — Other Ambulatory Visit: Payer: Self-pay | Admitting: Podiatry

## 2024-12-02 DIAGNOSIS — M25571 Pain in right ankle and joints of right foot: Secondary | ICD-10-CM

## 2024-12-02 NOTE — Progress Notes (Signed)
 Presents follow-up of possible gout flare fourth fifth met cuboid right.  Doing much better probably about 80 to 90% better just a little bit aching at this point.  Still finishing the prednisone  as prescribed.  She never went for the labs to get uric acid checked.  I asked her about her medicines she is taking hydrochlorothiazide for hypertension   Physical exam:  General appearance: Pleasant, and in no acute distress. AOx3.  Vascular: Pedal pulses: DP 2 to/4 bilaterally, PT 2/4 bilaterally.  Mild edema lower legs bilaterally. Capillary fill time immediate bilaterally.  Neurological: Grossly intact bilaterally  Dermatologic:   No redness over the foot today.  Skin normal temperature bilaterally.  Skin normal color, tone, and texture bilaterally.   Musculoskeletal: Slight soreness at the fourth fifth met cuboid joint right with palpation dorsally.  No tenderness with range of motion of the fourth fifth met cuboid joint right.   Diagnosis: 1.  Arthralgia fourth fifth met cuboid joint right probably secondary to gout.  Plan: -Established office visit for evaluation and management level 3. - Discussed with her the probable gout flare.  Suggested she go get the labs done so we can check with her uric acid is.  Symptoms symptomatically and clinically I think it was a gout flare.  She is taking hydrochlorothiazide I strongly advised her to talk to her primary care physician and possibly change to a different diuretic or give her hypertension medicine as this is notorious for triggering gout flares. -She can ice and as needed and use diclofenac gel on the foot as needed.  Return as needed

## 2024-12-03 LAB — URIC ACID: Uric Acid: 8.2 mg/dL — ABNORMAL HIGH (ref 3.0–7.2)
